# Patient Record
Sex: Female | Born: 1970 | Race: White | Hispanic: No | Marital: Married | Smoking: Never smoker
Health system: Southern US, Community
[De-identification: ages and names within clinical notes are randomized; demographics above are authoritative.]

## PROBLEM LIST (undated history)

## (undated) HISTORY — PX: APPENDECTOMY: SHX54

## (undated) HISTORY — PX: OTHER SURGICAL HISTORY: SHX169

---

## 1998-11-26 ENCOUNTER — Other Ambulatory Visit: Admission: RE | Admit: 1998-11-26 | Discharge: 1998-11-26 | Payer: Self-pay | Admitting: Obstetrics and Gynecology

## 1998-12-04 ENCOUNTER — Ambulatory Visit (HOSPITAL_COMMUNITY): Admission: RE | Admit: 1998-12-04 | Discharge: 1998-12-04 | Payer: Self-pay | Admitting: Obstetrics and Gynecology

## 1998-12-04 ENCOUNTER — Encounter: Payer: Self-pay | Admitting: Obstetrics and Gynecology

## 1998-12-20 ENCOUNTER — Ambulatory Visit (HOSPITAL_COMMUNITY): Admission: RE | Admit: 1998-12-20 | Discharge: 1998-12-20 | Payer: Self-pay | Admitting: Obstetrics and Gynecology

## 1998-12-20 ENCOUNTER — Encounter: Payer: Self-pay | Admitting: Obstetrics and Gynecology

## 1999-06-17 ENCOUNTER — Ambulatory Visit (HOSPITAL_COMMUNITY): Admission: RE | Admit: 1999-06-17 | Discharge: 1999-06-17 | Payer: Self-pay | Admitting: Obstetrics and Gynecology

## 1999-06-17 ENCOUNTER — Encounter: Payer: Self-pay | Admitting: Obstetrics and Gynecology

## 1999-12-20 ENCOUNTER — Other Ambulatory Visit: Admission: RE | Admit: 1999-12-20 | Discharge: 1999-12-20 | Payer: Self-pay | Admitting: Obstetrics and Gynecology

## 1999-12-30 ENCOUNTER — Ambulatory Visit (HOSPITAL_COMMUNITY): Admission: RE | Admit: 1999-12-30 | Discharge: 1999-12-30 | Payer: Self-pay | Admitting: Obstetrics and Gynecology

## 1999-12-30 ENCOUNTER — Encounter: Payer: Self-pay | Admitting: Obstetrics and Gynecology

## 2000-12-24 ENCOUNTER — Other Ambulatory Visit: Admission: RE | Admit: 2000-12-24 | Discharge: 2000-12-24 | Payer: Self-pay | Admitting: Obstetrics and Gynecology

## 2003-03-08 ENCOUNTER — Other Ambulatory Visit: Admission: RE | Admit: 2003-03-08 | Discharge: 2003-03-08 | Payer: Self-pay | Admitting: Obstetrics and Gynecology

## 2003-04-12 ENCOUNTER — Ambulatory Visit (HOSPITAL_COMMUNITY): Admission: RE | Admit: 2003-04-12 | Discharge: 2003-04-12 | Payer: Self-pay | Admitting: Obstetrics and Gynecology

## 2003-04-12 ENCOUNTER — Encounter: Payer: Self-pay | Admitting: Obstetrics and Gynecology

## 2003-05-31 ENCOUNTER — Other Ambulatory Visit: Admission: RE | Admit: 2003-05-31 | Discharge: 2003-05-31 | Payer: Self-pay | Admitting: Obstetrics and Gynecology

## 2004-01-17 ENCOUNTER — Other Ambulatory Visit: Admission: RE | Admit: 2004-01-17 | Discharge: 2004-01-17 | Payer: Self-pay | Admitting: Gynecology

## 2004-10-04 ENCOUNTER — Inpatient Hospital Stay (HOSPITAL_COMMUNITY): Admission: AD | Admit: 2004-10-04 | Discharge: 2004-10-04 | Payer: Self-pay | Admitting: Gynecology

## 2004-12-17 DIAGNOSIS — D229 Melanocytic nevi, unspecified: Secondary | ICD-10-CM

## 2004-12-17 HISTORY — DX: Melanocytic nevi, unspecified: D22.9

## 2005-03-24 ENCOUNTER — Other Ambulatory Visit: Admission: RE | Admit: 2005-03-24 | Discharge: 2005-03-24 | Payer: Self-pay | Admitting: Gynecology

## 2005-09-10 ENCOUNTER — Ambulatory Visit (HOSPITAL_BASED_OUTPATIENT_CLINIC_OR_DEPARTMENT_OTHER): Admission: RE | Admit: 2005-09-10 | Discharge: 2005-09-10 | Payer: Self-pay | Admitting: Surgery

## 2005-09-10 ENCOUNTER — Encounter (INDEPENDENT_AMBULATORY_CARE_PROVIDER_SITE_OTHER): Payer: Self-pay | Admitting: *Deleted

## 2007-07-29 ENCOUNTER — Other Ambulatory Visit: Admission: RE | Admit: 2007-07-29 | Discharge: 2007-07-29 | Payer: Self-pay | Admitting: Gynecology

## 2007-08-06 ENCOUNTER — Encounter: Admission: RE | Admit: 2007-08-06 | Discharge: 2007-08-06 | Payer: Self-pay | Admitting: Gynecology

## 2007-09-01 ENCOUNTER — Encounter: Admission: RE | Admit: 2007-09-01 | Discharge: 2007-09-01 | Payer: Self-pay | Admitting: Gynecology

## 2007-09-01 ENCOUNTER — Encounter (INDEPENDENT_AMBULATORY_CARE_PROVIDER_SITE_OTHER): Payer: Self-pay | Admitting: Diagnostic Radiology

## 2008-06-30 ENCOUNTER — Inpatient Hospital Stay (HOSPITAL_COMMUNITY): Admission: AD | Admit: 2008-06-30 | Discharge: 2008-06-30 | Payer: Self-pay | Admitting: Obstetrics and Gynecology

## 2008-07-14 IMAGING — US UNKNOWN PR STUDY
1 series · 12 of 12 positions shown · non-contrast
Comparison: none

US GUIDED CORE BIOPSY
Technologist: Jasinto Apollon, Medical

RIGHT BREAST ULTRASOUND-GUIDED CORE BIOPSY
CLINICAL DATA: 36-year-old seen August 06, 2007 for palpable mass right breast.  Diagnostic 
mammogram and right breast ultrasound at that time revealed a 1.7 x 1.3 x 0.7 cm mildly hypoechoic 
oval-shaped mass with lobulations, suggestive of fibroadenoma.  Initially, we planned to do a six 
month follow-up with ultrasound.  However, later in thinking about the patient's considerations of 
possibly becoming pregnant in the near future, I called Ms. Winnie to suggest that we perform 
biopsy of this newly palpable mass for pathologic diagnosis.  Therefore, the patient presents for 
biopsy today.

[Series 1: unknown pr study · 12 of 12 slices shown]
[im 1/12]
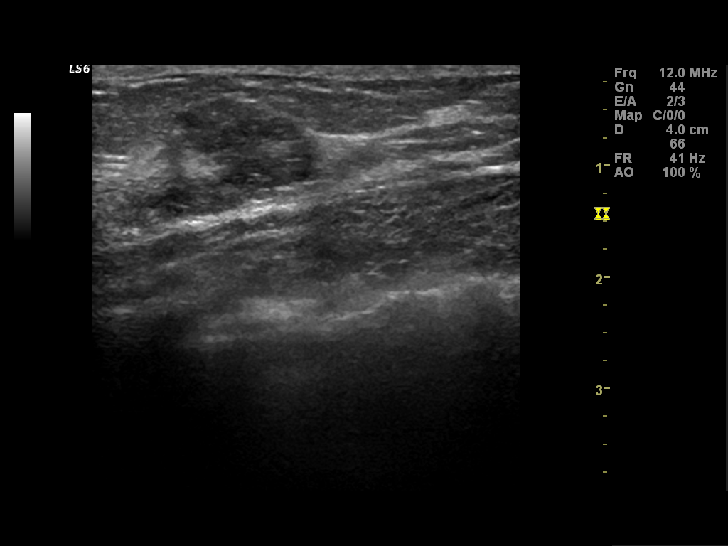
[im 2/12]
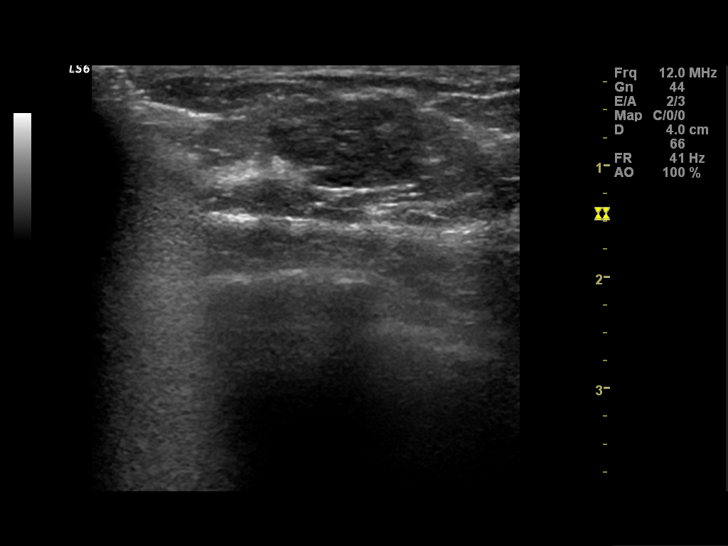
[im 3/12]
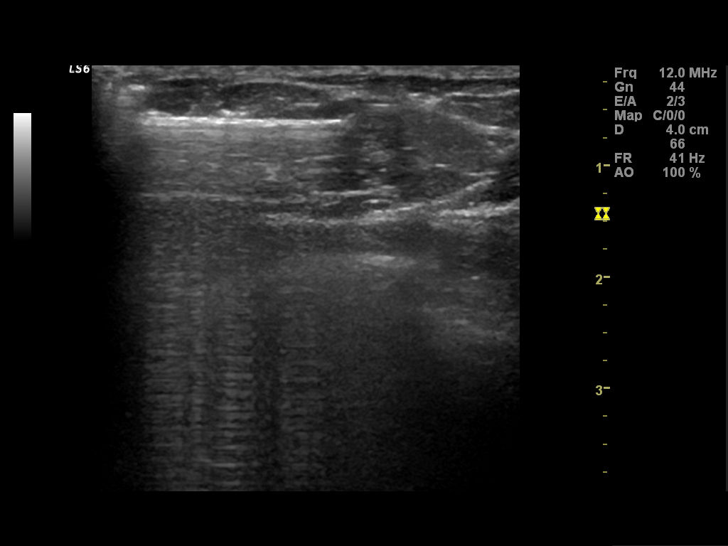
[im 4/12]
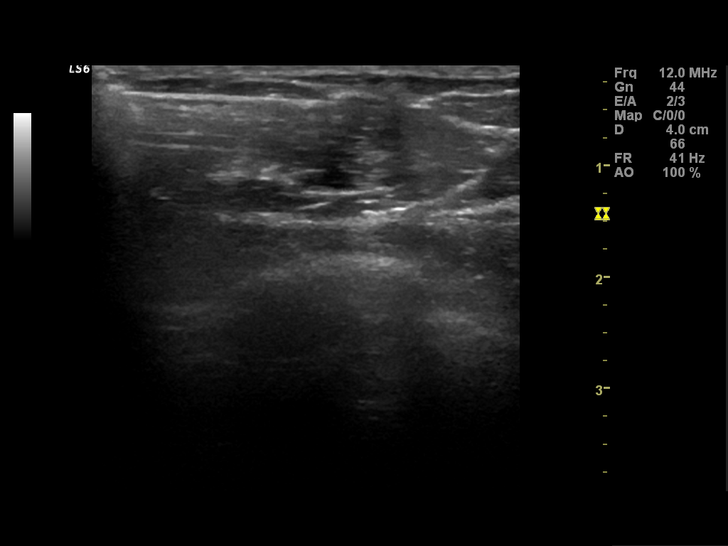
[im 5/12]
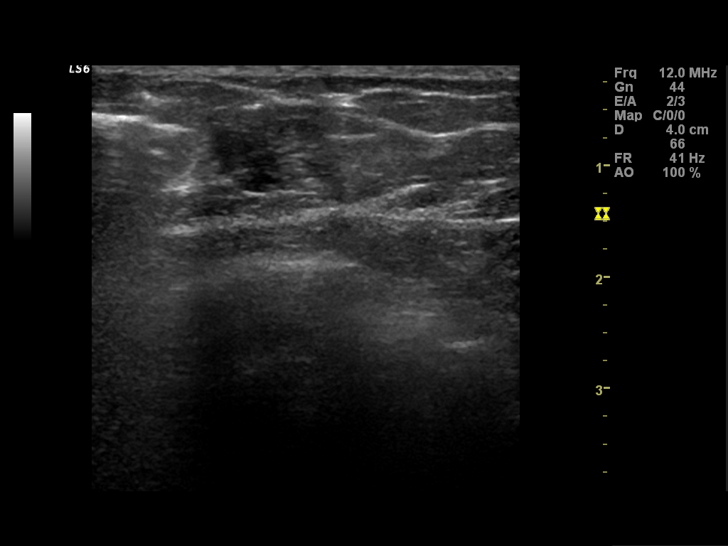
[im 6/12]
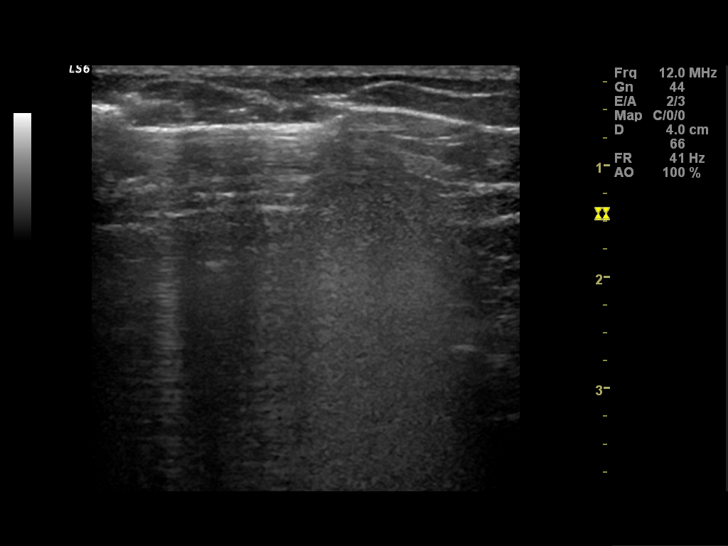
[im 7/12]
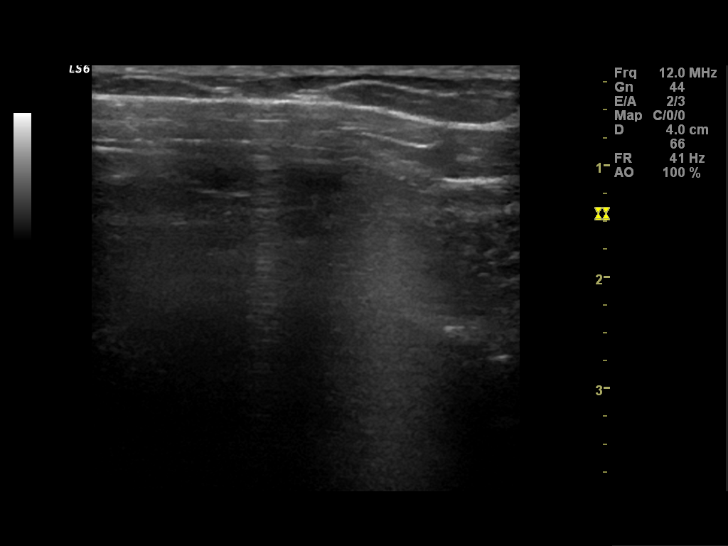
[im 8/12]
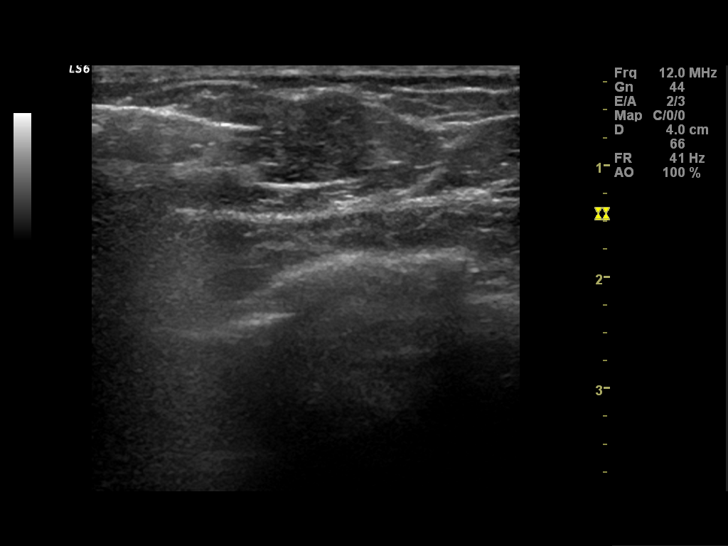
[im 9/12]
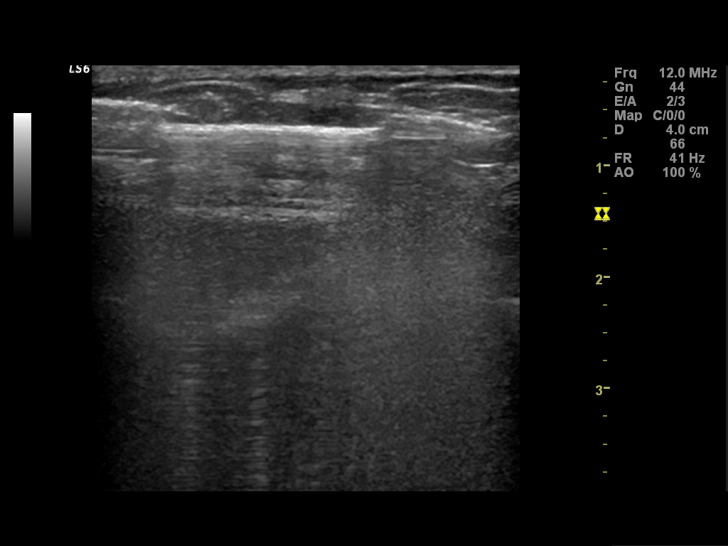
[im 10/12]
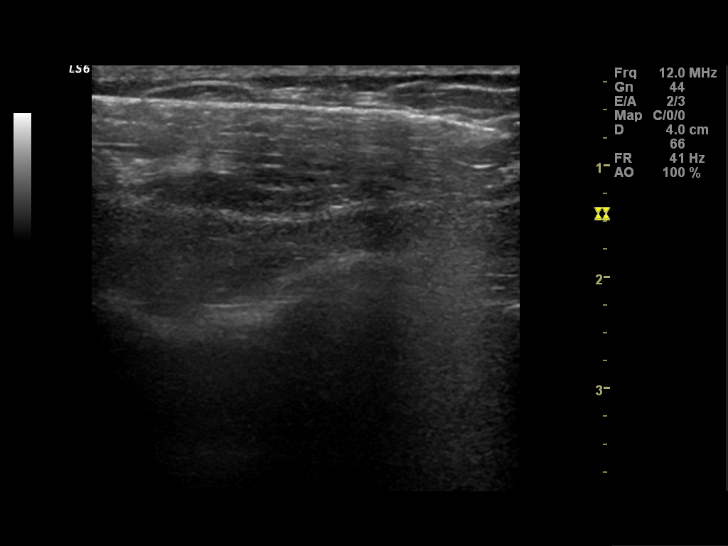
[im 11/12]
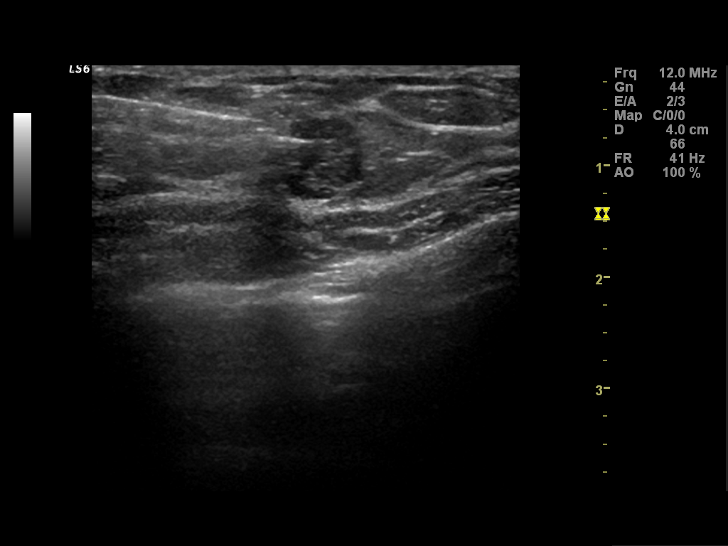
[im 12/12]
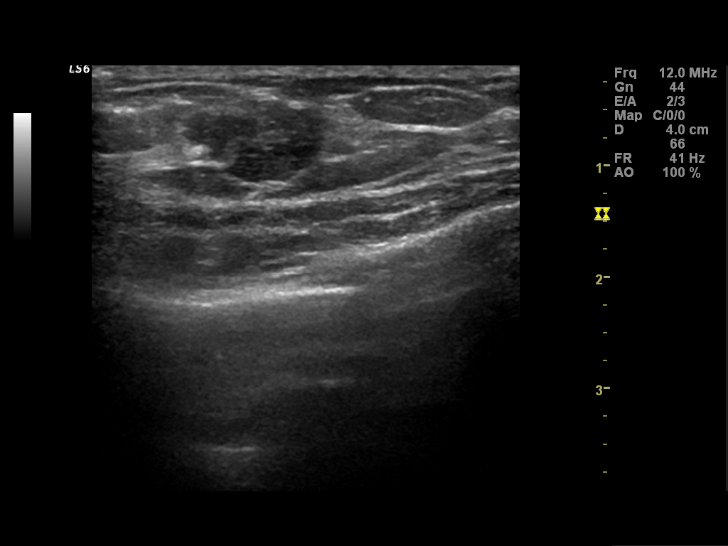

[12 of 12 positions shown; findings below may reference images not displayed]

Written informed consent was obtained. The possibilities of bleeding, hematoma formation, 
infection, non-diagnostic tissue sampling and the plan to place a biopsy marker were discussed with
the patient.

Using ultrasound guidance, sterile technique, 1% Lidocaine for local anesthesia, and a 12-gauge 
Celero biopsy device, three core samples were obtained of the mass at 3 o'clock position 
approximately 3 cm from the nipple.  Specimens were submitted to pathology in [place].  The 
patient would like to be called with biopsy results tomorrow.

A post-biopsy mammogram performed today shows the  metallic marker to be in good position, along 
the inferomedial margin of the mass.

PATHOLOGY RESULTS: BENIGN
Benign fibroadenoma.

Pathology results dictated by Rigoberto Meisner, RN, on 09-02-07.

Pathology reveals fibroadenoma.  This was found to be concordant by Dr. Maravilhas Alago.  I spoke with 
the patient by phone to give her the biopsy results. She stated that her biopsy site was negative 
for bleeding, bruising, or tenderness.  She was asked to return for yearly mammography at age 40.

Screening mammogram of both breasts at age 40.
,

## 2008-07-27 ENCOUNTER — Inpatient Hospital Stay (HOSPITAL_COMMUNITY): Admission: AD | Admit: 2008-07-27 | Discharge: 2008-07-27 | Payer: Self-pay | Admitting: Obstetrics and Gynecology

## 2008-08-01 ENCOUNTER — Inpatient Hospital Stay (HOSPITAL_COMMUNITY): Admission: RE | Admit: 2008-08-01 | Discharge: 2008-08-06 | Payer: Self-pay | Admitting: Obstetrics and Gynecology

## 2008-08-03 ENCOUNTER — Encounter (INDEPENDENT_AMBULATORY_CARE_PROVIDER_SITE_OTHER): Payer: Self-pay | Admitting: Obstetrics and Gynecology

## 2010-10-28 LAB — COMPREHENSIVE METABOLIC PANEL
ALT: 31 U/L (ref 0–35)
AST: 27 U/L (ref 0–37)
Albumin: 2.8 g/dL — ABNORMAL LOW (ref 3.5–5.2)
Alkaline Phosphatase: 122 U/L — ABNORMAL HIGH (ref 39–117)
Chloride: 106 mEq/L (ref 96–112)
GFR calc Af Amer: >60 mL/min (ref >60)
Potassium: 3.4 mEq/L — ABNORMAL LOW (ref 3.5–5.1)
Sodium: 136 mEq/L (ref 135–145)
Total Bilirubin: 0.2 mg/dL — ABNORMAL LOW (ref 0.3–1.2)

## 2010-10-28 LAB — CBC
HCT: 29.2 % — ABNORMAL LOW (ref 36.0–46.0)
HCT: 33.3 % — ABNORMAL LOW (ref 36.0–46.0)
Hemoglobin: 10.3 g/dL — ABNORMAL LOW (ref 12.0–15.0)
MCHC: 35.3 g/dL (ref 30.0–36.0)
Platelets: 237 10*3/uL (ref 150–400)
RBC: 3.17 MIL/uL — ABNORMAL LOW (ref 3.87–5.11)
RDW: 12.7 % (ref 11.5–15.5)
WBC: 10.6 10*3/uL — ABNORMAL HIGH (ref 4.0–10.5)

## 2010-11-26 NOTE — Op Note (Signed)
NAME:  Jennifer Schneider, Jennifer Schneider               ACCOUNT NO.:  [id number]   MEDICAL RECORD NO.:  [id number]          PATIENT TYPE:  INP   LOCATION:  9120                          FACILITY:  [facility]   PHYSICIAN:  Guy Sandifer. Henderson Cloud, M.D. DATE OF BIRTH:  [birth year]   DATE OF PROCEDURE:  08/03/2008  DATE OF DISCHARGE:                               OPERATIVE REPORT   PREOPERATIVE DIAGNOSIS:  Arrest of cervical dilation.   POSTOPERATIVE DIAGNOSIS:  Arrest of cervical dilation.   PROCEDURE:  Low transverse cesarean section.   SURGEON:  Guy Sandifer. Henderson Cloud, MD   ANESTHESIA:  Spinal, Jean Rosenthal, MD   ESTIMATED BLOOD LOSS:  800 mL.   SPECIMENS:  Placenta to Pathology.   FINDINGS:  Viable female infant, Apgars of 9 and 9 at 1 and 5 minutes  respectively.  Birth weight is 7 pounds 5 ounces.  Arterial cord pH  7.37.   INDICATIONS AND CONSENT:  This patient is a 40 year old G1, P0 with an  EDC of August 08, 2008, who presents for induction of labor with  elevated blood pressures.  Laboratories were normal.  She undergo  Cytotec induction and then Pitocin.  Yesterday, the Pitocin was  discontinued.  The Cytotec was repeated and the Pitocin was resumed  again today.  As of 8:10 this morning, cervix was closed, thick, soft,  and -3 in vertex.  Blood pressures were stable, and deep tendon reflexes  were 2+.  She had had Cytotec twice last evening and was on Pitocin.  The patient then had spontaneous rupture of membranes for copious  amounts of clear fluid.  Cervix is fingertip, 80%, -3, and posterior in  location.  Fetal heart tones were reactive.  Pitocin was at 24  milliunits.  Pitocin was continued until 4:30 p.m. where the  contractions continued to be irregular in mild, and the cervix was  without change.  Arrest of dilation was discussed with the patient.  Cesarean section was discussed.  Potential risks and complications were  discussed preoperatively including, but not limited to infection, organ  damage,  bleeding requiring transfusion of blood products with HIV and  hepatitis acquisition, DVT, PE, and pneumonia.  All questions have been  answered and consent was signed on the chart.   PROCEDURE IN DETAIL:  The patient was taken to the operating room where  she was identified, spinal anesthetic was placed, and she was placed in  the dorsal supine position with a 15-degree left lateral wedge.  She was  then prepped, bladder was drained with an indwelling Foley catheter, and  she was draped in a sterile fashion.  After testing for adequate spinal  anesthesia, skin was entered through a Pfannenstiel incision and  dissection was carried out in layers to the peritoneum.  The peritoneum  was incised and extended superiorly and inferiorly.  Vesicouterine  peritoneum was taken down cephalad laterally, the bladder flap was  developed, and the bladder blade was placed.  Uterus was incised in a  low transverse manner and the uterine cavity was entered bluntly with a  hemostat.  The uterine incision was extended cephalad  laterally with the  finger.  The vertex was elevated.  Vacuum extractor was used to elevate  the vertex through the rectus muscles.  One gentle traction on the  vacuum extractor with no pop offs was used.  This easily elevates the  vertex.  Suction was released, the oronasopharynx was thoroughly  suctioned, and the baby was delivered.  Good cry and tone was noted.  Cord was clamped and cut, and the baby was handed to the awaiting  pediatrics team.  Placenta was manually delivered and sent to Pathology.  Uterine cavity was clean.  Uterus was closed in 2 running locking  imbricating layers of 0 Monocryl suture.  There was a small amount of  bleeding in the center of the incision, which was closed with a third  layer of 0 Monocryl across the middle one third of the incision, which  achieved good hemostasis.  Tubes and ovaries were normal bilaterally.  Irrigation was carried out.   Anterior peritoneum was closed in a running  fashion, which was also used to reapproximate the pyramidalis muscle in  the midline.  Anterior rectus fascia was closed in a running fashion  with double-looped 0 PDS suture.  Skin was closed with clips.  All  sponge, instrument, and needle counts were correct, and the patient was  transferred to recovery room in stable condition.      Guy Sandifer Henderson Cloud, M.D.  Electronically Signed     JET/MEDQ  D:  08/03/2008  T:  08/04/2008  Job:  [id number]

## 2010-11-29 NOTE — Op Note (Signed)
NAME:  NIKEISHA, KLUTZ               ACCOUNT NO.:  [id number]   MEDICAL RECORD NO.:  [id number]          PATIENT TYPE:  AMB   LOCATION:  DSC                          FACILITY:  [facility]   PHYSICIAN:  Thornton Park. Daphine Deutscher, MD  DATE OF BIRTH:  [birth year]   DATE OF PROCEDURE:  09/10/2005  DATE OF DISCHARGE:                                 OPERATIVE REPORT   PREOPERATIVE DIAGNOSIS:  Palpable mass right breast.   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   PROCEDURE:  Right breast biopsy.   SURGEON:  Thornton Park. Daphine Deutscher, MD   ANESTHESIA:  Local MAC.   DESCRIPTION OF PROCEDURE:  Keimora Swartout was localized and marked and taken  to room 4.  The breast was prepped with chlorhexidine and draped sterilely.  I infiltrated the area with a mixture of lidocaine and sodium bicarb.  A  curvilinear incision was made about 2 fingerbreadths from the areolar margin  and carried down.  Superior and inferior flaps were made overlying the mass.  It was then grasped with a suture of 4-0 Vicryl and held and then was  dissected free from the surrounding breast tissue with Metzenbaum scissors.  It was removed and sent for permanent sections.  In the meantime, the breast  cavity was cauterized with electrocautery.  It was injected with more  lidocaine and closed 4-0 and 5-0 Vicryl with Dermabond on the outside skin.  The patient tolerated procedure well and was taken to recovery room  satisfactory addition.  She was given a prescription for Tylox to take for  pain and will follow-up in the office 2-3 weeks.      Thornton Park Daphine Deutscher, MD  Electronically Signed     MBM/MEDQ  D:  09/10/2005  T:  09/10/2005  Job:  [id number]

## 2010-11-29 NOTE — Discharge Summary (Signed)
NAME:  Jennifer Schneider, Jennifer Schneider               ACCOUNT NO.:  [id number]   MEDICAL RECORD NO.:  [id number]          PATIENT TYPE:  INP   LOCATION:  9120                          FACILITY:  [facility]   PHYSICIAN:  Freddy Finner, M.D.   DATE OF BIRTH:  1970/07/18   DATE OF ADMISSION:  08/01/2008  DATE OF DISCHARGE:  08/06/2008                               DISCHARGE SUMMARY   ADMITTING DIAGNOSES:  1. Intrauterine pregnancy at 74 weeks' estimated gestational age.  2. Mild pregnancy-induced hypertension.  3. Induction of labor.   DISCHARGE DIAGNOSES:  1. Status post low transverse cesarean section secondary to failed      induction.  2. Viable female infant.   PROCEDURE:  Primary low transverse cesarean section.   REASON FOR ADMISSION:  Please see written H and P.   HOSPITAL COURSE:  The patient is a 39 year old married female,  primigravida, who was admitted to [facility] for an  induction of labor at 44 weeks' estimated gestational age for mild  pregnancy-induced hypertension.  On admission, vital signs were stable.  Blood pressure 130/84.  Cervix was closed posterior in vertex  presentation.  Fetal heart tones were reactive at 142 beats per minute.  Cytotec was administered that evening, and Pitocin was started the  following morning.  The patient did undergo 2 Cytotec with Pitocin and  with no further change in cervix throughout the day beyond a fingertip  dilated, 80% effaced and a -3 station.  Due to failed a induction,  decision was made to proceed with a primary low transverse cesarean  section.  The patient was then taken to the operating room where spinal  anesthesia was administered without difficulty.  A low transverse  incision was made with delivery of a viable female infant weighing 7  pounds 6 ounces with Apgars of 9 at 1 minute and 9 at 5 minutes.  The  patient tolerated the procedure well and was taken to the recovery room  in stable condition.  On postoperative day 1,  the patient was with  complaints of itching.  Otherwise, vital signs were stable.  She was  afebrile.  Fundus firm and nontender.  Abdominal dressing was noted to  be clean, dry, and intact.  Laboratory findings showed a hemoglobin of  10.7.  On postoperative day 2, the patient was without complaint.  Vital  signs remained stable.  She was afebrile.  Fundus firm and nontender.  Abdominal dressing had been removed revealing an incision that was  clean, dry, and intact.  On postoperative day 3, the patient was without  complaint.  Vital signs remained stable.  She was afebrile.  Fundus firm  and nontender.  Incision was clean, dry, and intact.  Staples were  removed, and the patient was later discharged home.   CONDITION ON DISCHARGE:  Stable.   DIET:  Regular as tolerated.   ACTIVITY:  No heavy lifting, no driving x2 weeks, no vaginal entry.   FOLLOWUP:  The patient to follow up in the office in 1-2 weeks for an  incision check.  She is to call  for temperature greater than 100  degrees, persistent nausea, vomiting, heavy vaginal bleeding, and/or  redness or drainage from the incisional site.   DISCHARGE MEDICATIONS:  1. Tylox #30 one p.o. every 4-6 hours.  2. Motrin 600 mg every 6 hours.  3. Prenatal vitamins 1 p.o. daily.  4. Colace 1 p.o. daily.      Julio Sicks, N.P.      Freddy Finner, M.D.  Electronically Signed    CC/MEDQ  D:  08/28/2008  T:  08/28/2008  Job:  [id number]

## 2011-04-18 LAB — CBC
HCT: 35.4 % — ABNORMAL LOW (ref 36.0–46.0)
Hemoglobin: 11.9 g/dL — ABNORMAL LOW (ref 12.0–15.0)
MCV: 93.1 fL (ref 78.0–100.0)
Platelets: 259 10*3/uL (ref 150–400)
RDW: 12.9 % (ref 11.5–15.5)
WBC: 8.3 10*3/uL (ref 4.0–10.5)

## 2011-04-18 LAB — URINALYSIS, ROUTINE W REFLEX MICROSCOPIC
Glucose, UA: NEGATIVE mg/dL
Hgb urine dipstick: NEGATIVE
Ketones, ur: NEGATIVE mg/dL
Protein, ur: NEGATIVE mg/dL
pH: 7 (ref 5.0–8.0)

## 2011-04-18 LAB — COMPREHENSIVE METABOLIC PANEL
Albumin: 3 g/dL — ABNORMAL LOW (ref 3.5–5.2)
Alkaline Phosphatase: 101 U/L (ref 39–117)
BUN: 4 mg/dL — ABNORMAL LOW (ref 6–23)
Chloride: 102 mEq/L (ref 96–112)
Creatinine, Ser: 0.44 mg/dL (ref 0.4–1.2)
Glucose, Bld: 98 mg/dL (ref 70–99)
Total Bilirubin: 0.5 mg/dL (ref 0.3–1.2)

## 2011-04-18 LAB — LACTATE DEHYDROGENASE: LDH: 139 U/L (ref 94–250)

## 2011-04-18 LAB — URIC ACID: Uric Acid, Serum: 3.3 mg/dL (ref 2.4–7.0)

## 2011-12-10 ENCOUNTER — Other Ambulatory Visit: Payer: Self-pay | Admitting: Dermatology

## 2012-12-28 ENCOUNTER — Other Ambulatory Visit: Payer: Self-pay | Admitting: Obstetrics and Gynecology

## 2012-12-28 DIAGNOSIS — R928 Other abnormal and inconclusive findings on diagnostic imaging of breast: Secondary | ICD-10-CM

## 2013-01-12 ENCOUNTER — Other Ambulatory Visit: Payer: Self-pay

## 2013-01-24 ENCOUNTER — Other Ambulatory Visit: Payer: Self-pay

## 2013-02-01 ENCOUNTER — Ambulatory Visit
Admission: RE | Admit: 2013-02-01 | Discharge: 2013-02-01 | Disposition: A | Discharge status: Home / Self Care | Payer: Commercial Indemnity | Source: Ambulatory Visit | Attending: Obstetrics and Gynecology | Admitting: Obstetrics and Gynecology

## 2013-02-01 DIAGNOSIS — R928 Other abnormal and inconclusive findings on diagnostic imaging of breast: Secondary | ICD-10-CM

## 2015-07-03 ENCOUNTER — Other Ambulatory Visit: Payer: Self-pay | Admitting: Obstetrics and Gynecology

## 2015-07-03 DIAGNOSIS — R928 Other abnormal and inconclusive findings on diagnostic imaging of breast: Secondary | ICD-10-CM

## 2015-07-10 ENCOUNTER — Ambulatory Visit
Admission: RE | Admit: 2015-07-10 | Discharge: 2015-07-10 | Disposition: A | Discharge status: Home / Self Care | Payer: Managed Care, Other (non HMO) | Source: Ambulatory Visit | Attending: Obstetrics and Gynecology | Admitting: Obstetrics and Gynecology

## 2015-07-10 DIAGNOSIS — R928 Other abnormal and inconclusive findings on diagnostic imaging of breast: Secondary | ICD-10-CM

## 2016-05-22 IMAGING — MG MM DIAG BREAST TOMO UNI RIGHT
4 series · 4 of 12 positions shown · non-contrast
Comparison: Previous exam(s).

CLINICAL DATA: Screening recall for a possible mass in the right
breast.

EXAM:
DIGITAL DIAGNOSTIC RIGHT MAMMOGRAM WITH 3D TOMOSYNTHESIS WITH CAD
ULTRASOUND RIGHT BREAST

[R CC]
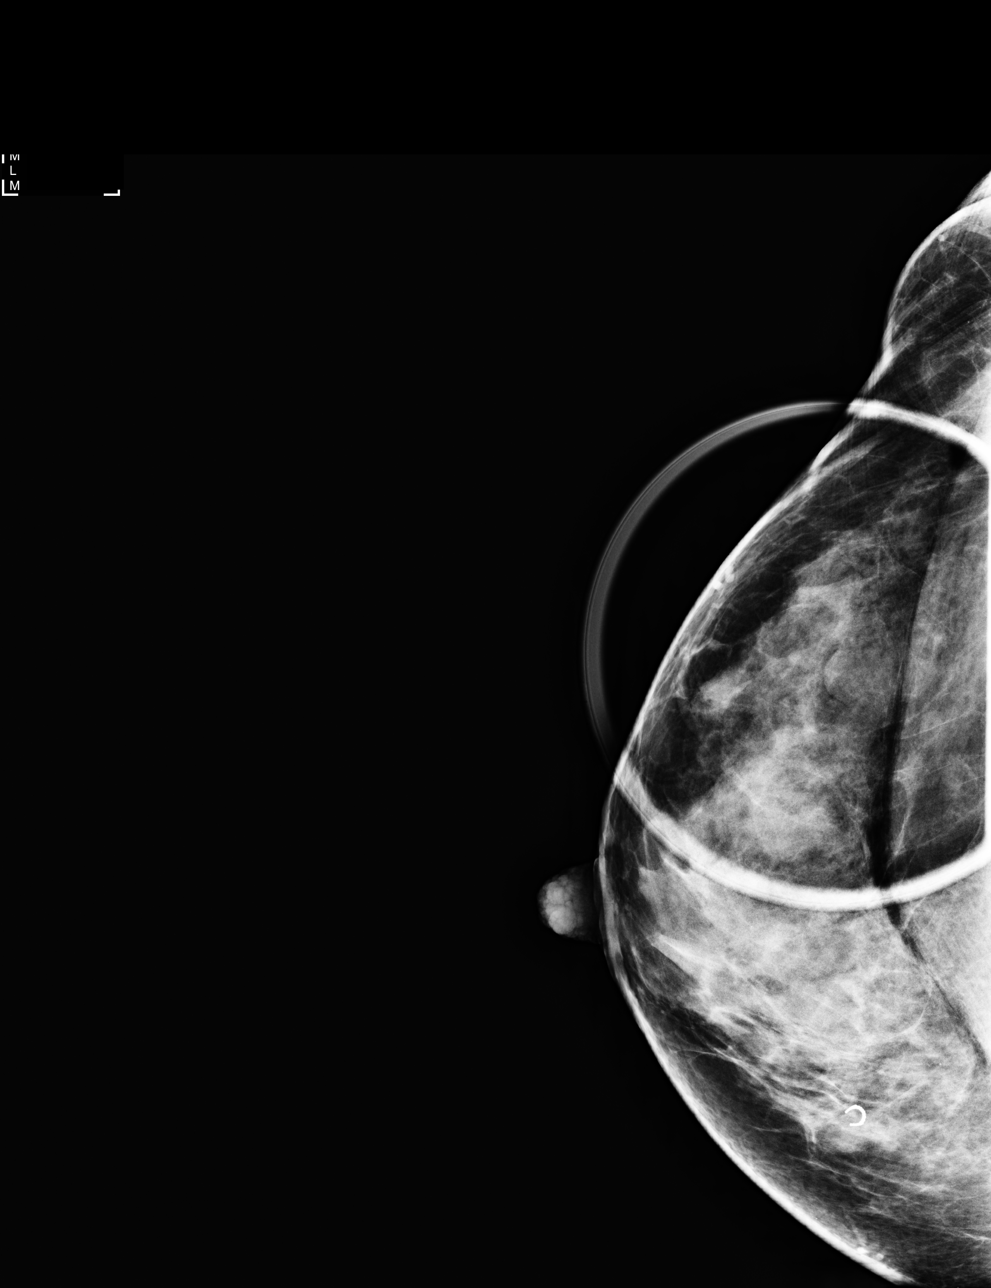

[R MLO]
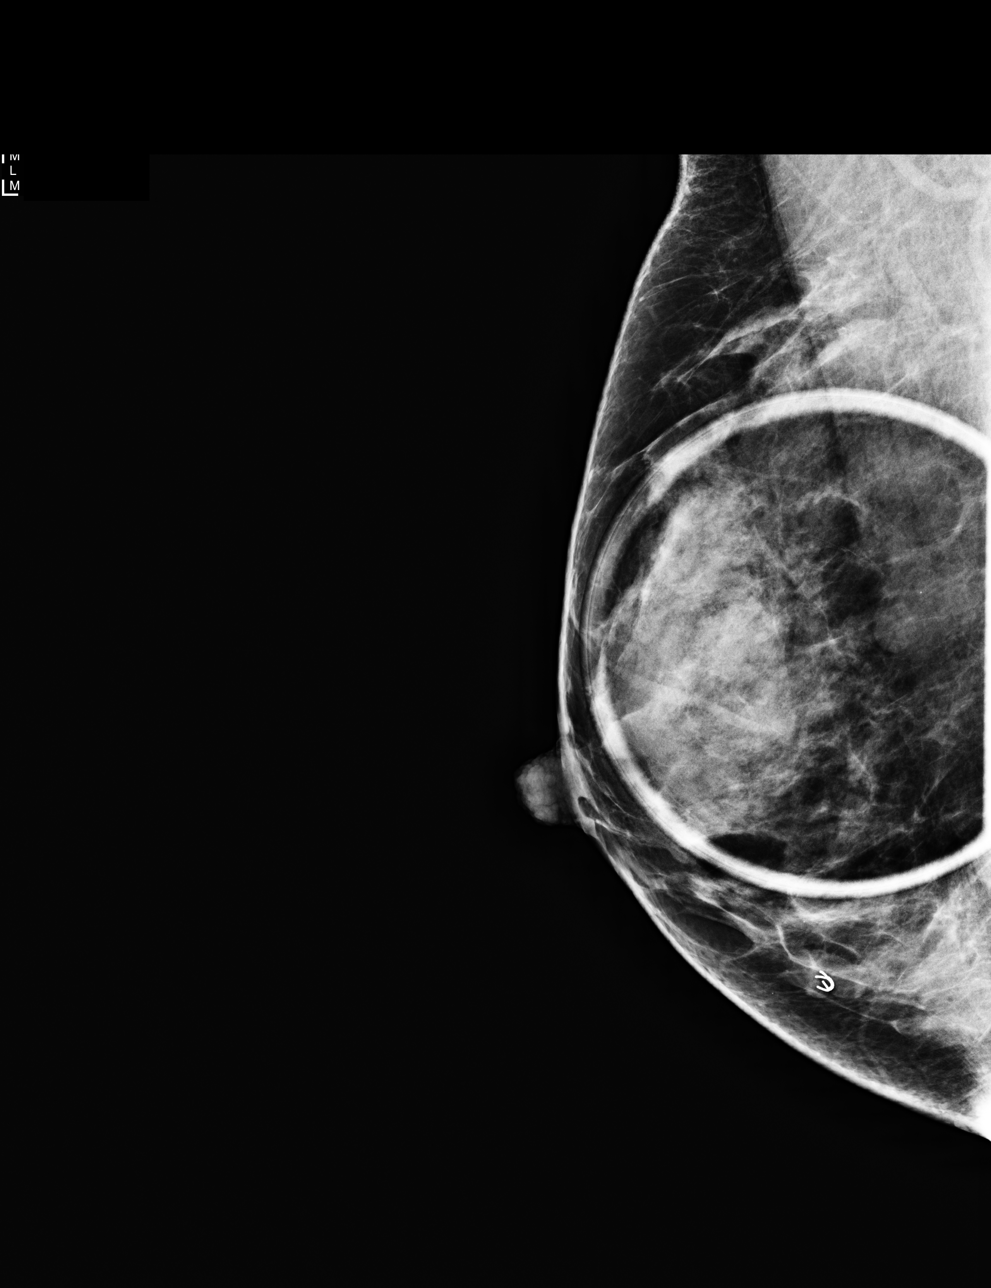

[R MLO tomo · tomo slice 25/48.0]
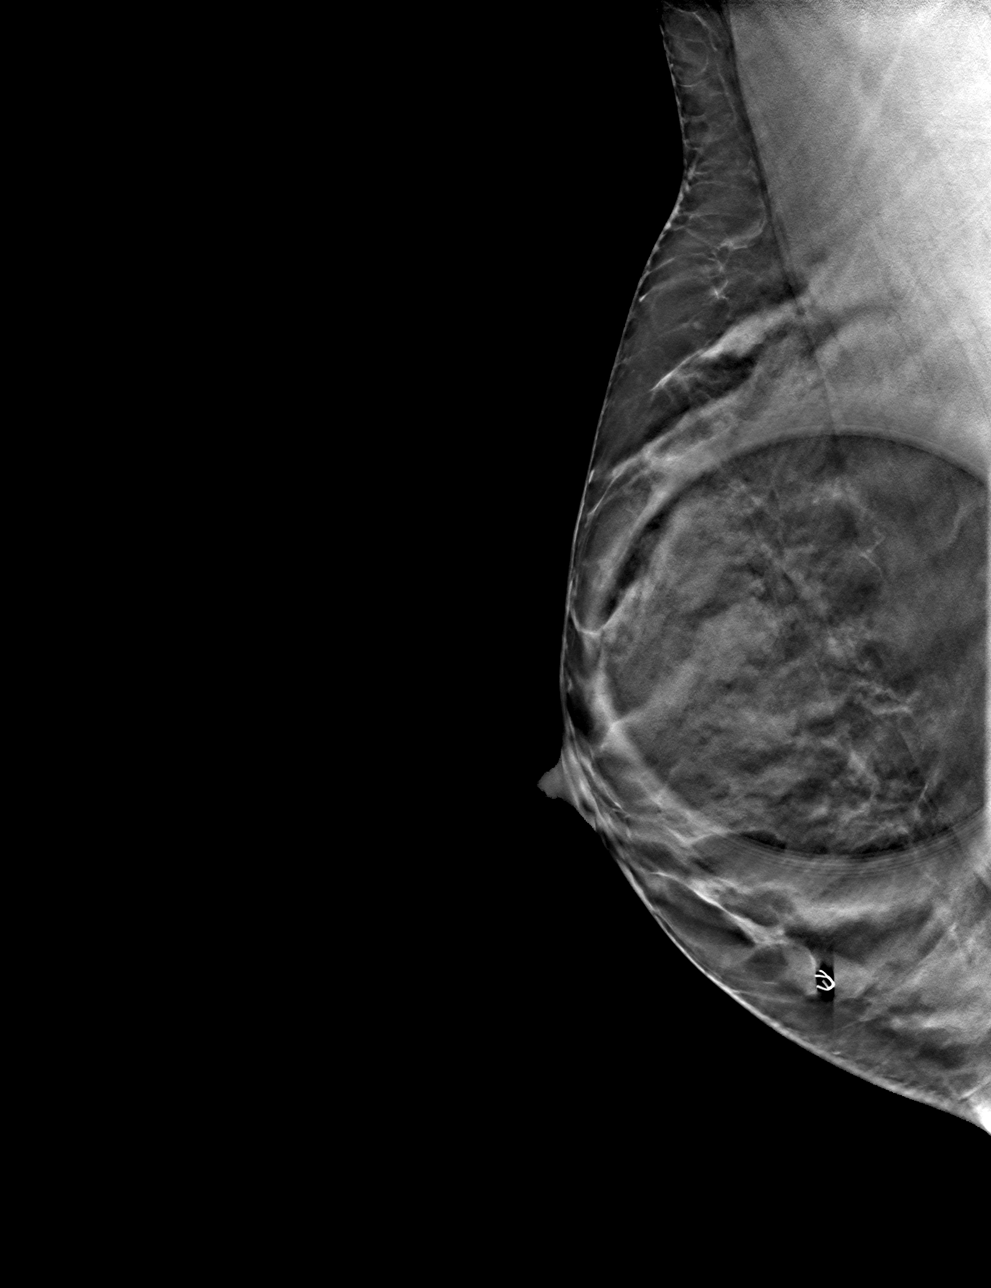

[R CC tomo · tomo slice 27/54.0]
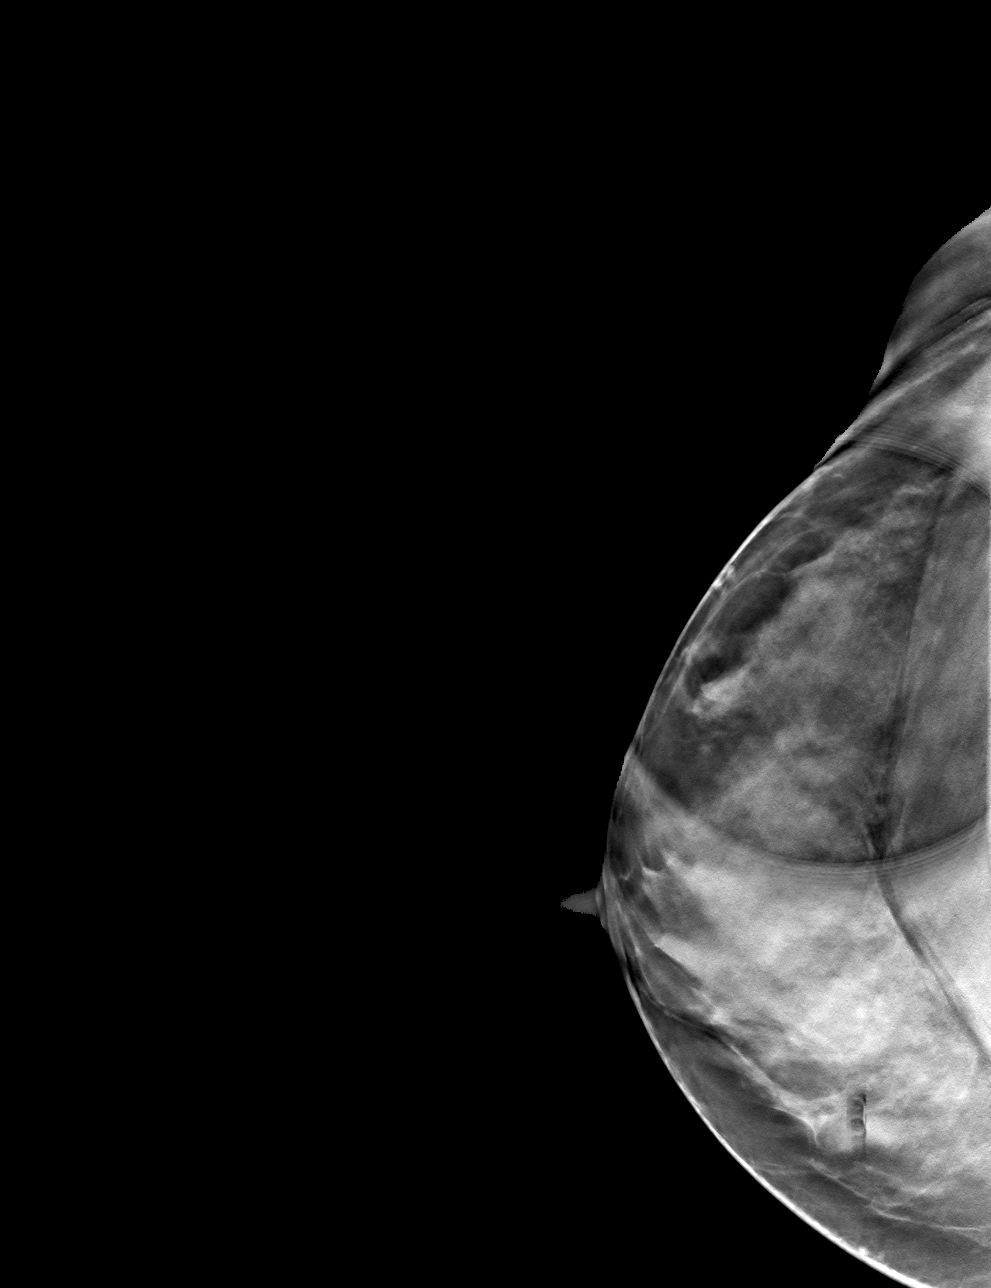

[4 of 12 positions shown; findings below may reference images not displayed]

ACR Breast Density Category c: The breast tissue is heterogeneously
dense, which may obscure small masses.
FINDINGS: On the spot-compression 3D images, the mass persists lying in the
lateral right breast, posterior [DATE]. The mass is margins are partly
visualized mammographically, and appear circumscribed. There are no
other discrete masses and there are no areas of architectural
distortion.

Mammographic images were processed with CAD.

On physical exam, no mass is palpable in the lateral right breast.

Targeted ultrasound is performed, showing a simple oval cyst in the
right breast at the 8:30 o'clock position, 4 cm the nipple,
measuring 9 x 5 x 8 mm. There is an adjacent cluster of small cysts.
There are no solid masses or suspicious lesions. The 9 mm cyst
corresponds in size, shape and location to the mammographic finding.
IMPRESSION: Benign right breast cyst.  No evidence of malignancy.

RECOMMENDATION:
Screening mammogram in one year.(Code:M2-O-G28)

I have discussed the findings and recommendations with the patient.
Results were also provided in writing at the conclusion of the
visit. If applicable, a reminder letter will be sent to the patient
regarding the next appointment.

BI-RADS CATEGORY  2: Benign.

## 2019-05-02 ENCOUNTER — Other Ambulatory Visit: Payer: Self-pay | Admitting: Obstetrics and Gynecology

## 2019-05-02 DIAGNOSIS — R928 Other abnormal and inconclusive findings on diagnostic imaging of breast: Secondary | ICD-10-CM

## 2019-05-09 ENCOUNTER — Ambulatory Visit
Admission: RE | Admit: 2019-05-09 | Discharge: 2019-05-09 | Disposition: A | Discharge status: Home / Self Care | Payer: 59 | Source: Ambulatory Visit | Attending: Obstetrics and Gynecology | Admitting: Obstetrics and Gynecology

## 2019-05-09 ENCOUNTER — Other Ambulatory Visit: Payer: Self-pay

## 2019-05-09 DIAGNOSIS — R928 Other abnormal and inconclusive findings on diagnostic imaging of breast: Secondary | ICD-10-CM

## 2019-10-03 ENCOUNTER — Other Ambulatory Visit: Payer: Self-pay

## 2019-10-03 ENCOUNTER — Encounter: Payer: Self-pay | Admitting: Physician Assistant

## 2019-10-03 ENCOUNTER — Ambulatory Visit (INDEPENDENT_AMBULATORY_CARE_PROVIDER_SITE_OTHER): Payer: 59 | Admitting: Physician Assistant

## 2019-10-03 DIAGNOSIS — Z808 Family history of malignant neoplasm of other organs or systems: Secondary | ICD-10-CM | POA: Diagnosis not present

## 2019-10-03 DIAGNOSIS — D485 Neoplasm of uncertain behavior of skin: Secondary | ICD-10-CM

## 2019-10-03 DIAGNOSIS — Z1283 Encounter for screening for malignant neoplasm of skin: Secondary | ICD-10-CM | POA: Diagnosis not present

## 2019-10-03 DIAGNOSIS — D492 Neoplasm of unspecified behavior of bone, soft tissue, and skin: Secondary | ICD-10-CM

## 2019-10-03 DIAGNOSIS — L820 Inflamed seborrheic keratosis: Secondary | ICD-10-CM

## 2019-10-03 NOTE — Patient Instructions (Signed)

## 2019-10-03 NOTE — Progress Notes (Addendum)
   New Patient Visit  Subjective  Jennifer Schneider is a 49 y.o. female who presents for the following: Skin Problem (Patient here for general skin check. Nothing stands out that is concerning. Mom had a history of melanoma.  [facility] There are some spots that rub on her clothes. She has had moles removed in the past and all were benign. Mom's melanoma was on her hip and it was caught early and has not caused her any further problems.   Objective  Well appearing patient in no apparent distress; mood and affect are within normal limits.  A full examination was performed including scalp, head, eyes, ears, nose, lips, neck, chest, axillae, abdomen, back, buttocks, bilateral upper extremities, bilateral lower extremities, hands, feet, fingers, toes, fingernails, and toenails. All findings within normal limits unless otherwise noted below. Patient has many moles with no distinct mole pattern. Photos were taken to observe for changes.  Objective  Left Lower Back (4), Right Abdomen (side) - Upper (3), Right Lower Back (4), Right Temple: Erythematous stuck-on, waxy papule or plaque.   Objective  Left Lower Back: 6.5 to sk     Objective  Right Axilla: 5cm to raised SK.     Objective  Right Lower Leg - Anterior: 3.5 to freckle     Objective  Left Shoulder - Anterior: 5cm to raised mole     Objective  Chest - [facility]): Head to toe skin cancer screening.  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) Right Abdomen (side) - Upper (3); Left Lower Back (4); Right Lower Back (4); Right Temple  Destruction of lesion - Left Lower Back, Right Abdomen (side) - Upper, Right Lower Back, Right Temple Complexity: simple   Destruction method: cryotherapy   Informed consent: discussed and consent obtained   Timeout:  patient name, date of birth, surgical site, and procedure verified Lesion destroyed using liquid nitrogen: Yes   Outcome:  patient tolerated procedure well with no complications    Neoplasm of skin (4) Left Lower Back  Skin / nail biopsy Type of biopsy: tangential   Informed consent: discussed and consent obtained   Outcome: patient tolerated procedure well   Post-procedure details: wound care instructions given    Specimen 1 - Surgical pathology Differential Diagnosis: atypia Check Margins: No  Right Axilla  Skin / nail biopsy Type of biopsy: tangential   Hemostasis achieved with: aluminum chloride   Outcome: patient tolerated procedure well   Post-procedure details: wound care instructions given    Specimen 2 - Surgical pathology Differential Diagnosis: Atypia Check Margins: No  Right Lower Leg - Anterior  Skin / nail biopsy Type of biopsy: tangential   Hemostasis achieved with: aluminum chloride   Outcome: patient tolerated procedure well   Post-procedure details: wound care instructions given    Specimen 3 - Surgical pathology Differential Diagnosis: Atypia Check Margins: No  Left Shoulder - Anterior  Skin / nail biopsy Type of biopsy: tangential   Hemostasis achieved with: aluminum chloride   Outcome: patient tolerated procedure well   Post-procedure details: wound care instructions given    Specimen 4 - Surgical pathology Differential Diagnosis: Atypia Check Margins: No  Skin exam for malignant neoplasm Family History of Melanoma   I, Kahlia Lagunes Clark-Bruning, PA-C, have reviewed all documentation for this visit. The documentation on 11/11/19 for the exam, diagnosis, procedures, and orders are all accurate and complete.

## 2019-10-06 ENCOUNTER — Telehealth: Payer: Self-pay

## 2019-10-06 DIAGNOSIS — D239 Other benign neoplasm of skin, unspecified: Secondary | ICD-10-CM

## 2019-10-06 NOTE — Telephone Encounter (Signed)
-----   Message from Arlyss Gandy, [place] sent at 10/05/2019  5:07 PM EDT ----- She has a mild on her back we can just watch. The atypical melanocytic proliferation should go to [facility]. The severe on her shoulder will need a wider shave depending on when she gets  [facility] appt.

## 2019-10-06 NOTE — Telephone Encounter (Signed)
Spoke with patient and path results given.  Told her that we would send everything over to the [facility] and get her scheduled for [facility] and then we will schedule her for a widershave w/ [facility] after we have the appointment with the [facility].

## 2019-10-12 ENCOUNTER — Telehealth: Payer: Self-pay

## 2019-10-12 NOTE — Telephone Encounter (Signed)
Fax from [place] to notify us that the patient has an appointment on April 14,2021 at 3:00pm with Dr. Winifred Olive in [place].

## 2019-11-10 ENCOUNTER — Encounter: Payer: Self-pay | Admitting: *Deleted

## 2020-03-21 IMAGING — US US BREAST*R* LIMITED INC AXILLA
1 series · 5 of 5 positions shown · non-contrast
Comparison: Previous exam(s).

CLINICAL DATA: Patient was called back from screening mammogram for
a right breast mass.

EXAM:
ULTRASOUND OF THE RIGHT BREAST

[Series 1: us breast*right* limited inc axilla · 0.07mm/px · 5 of 5 slices shown]
[im 1/5]
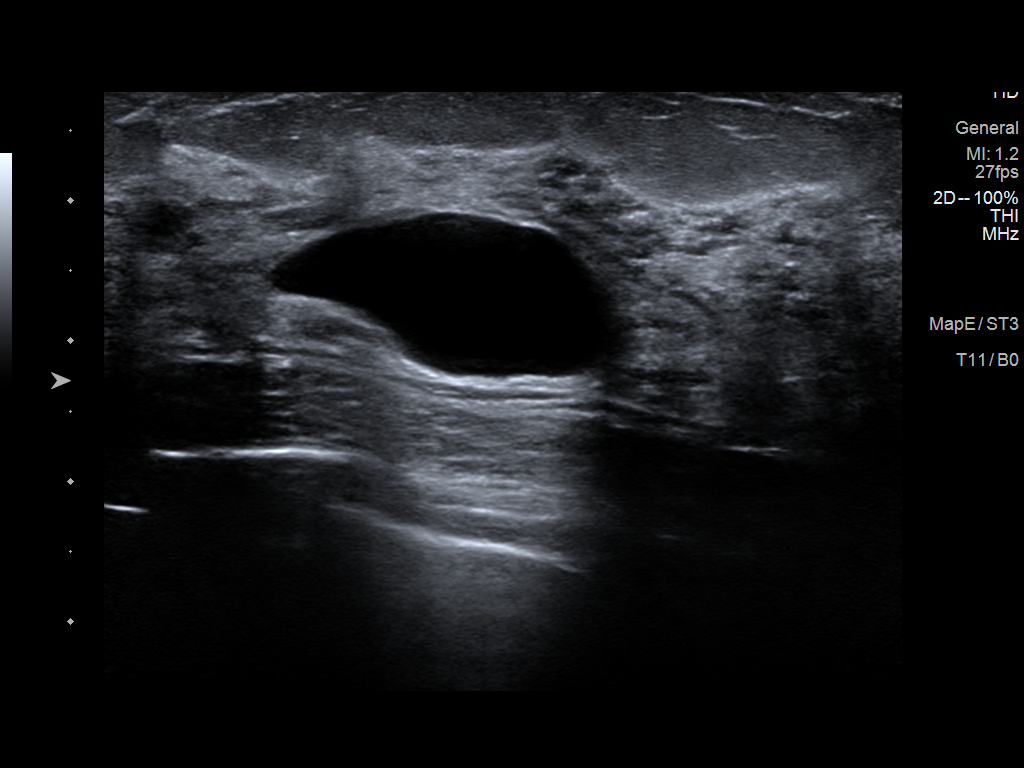
[im 2/5]
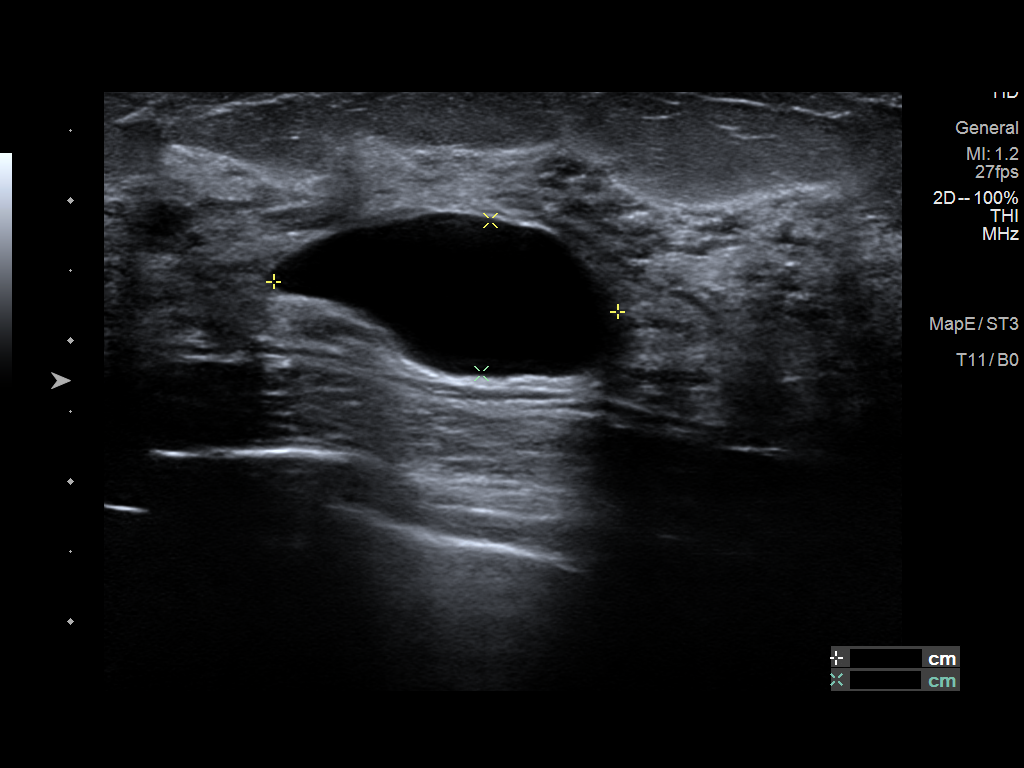
[im 3/5]
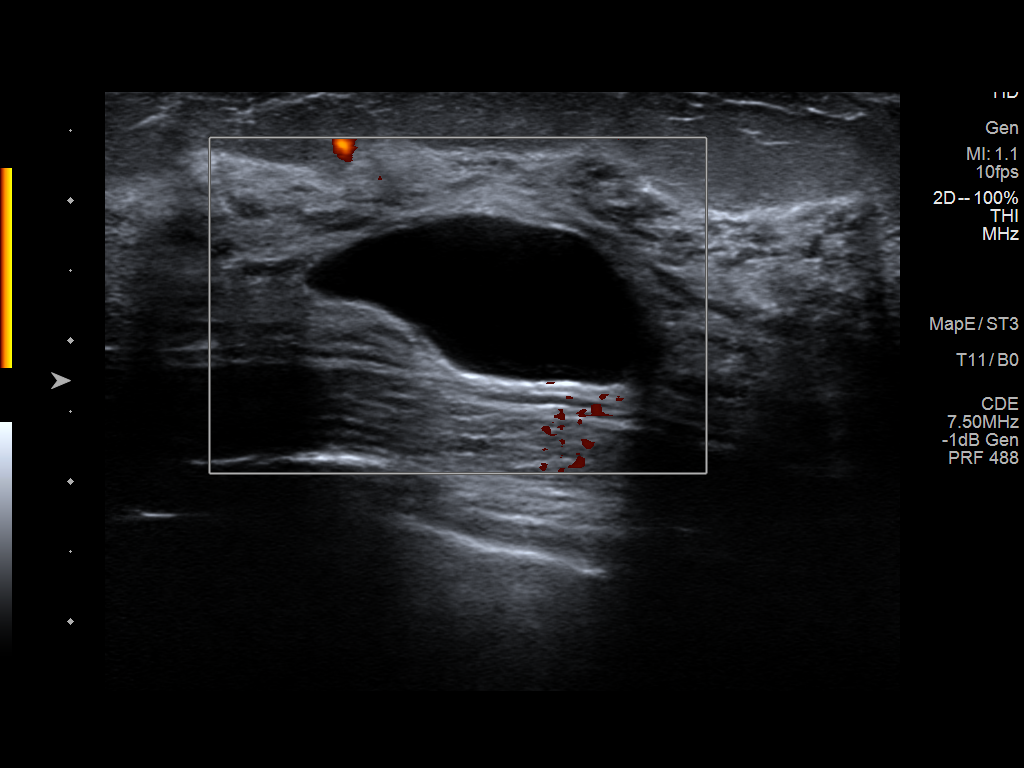
[im 4/5]
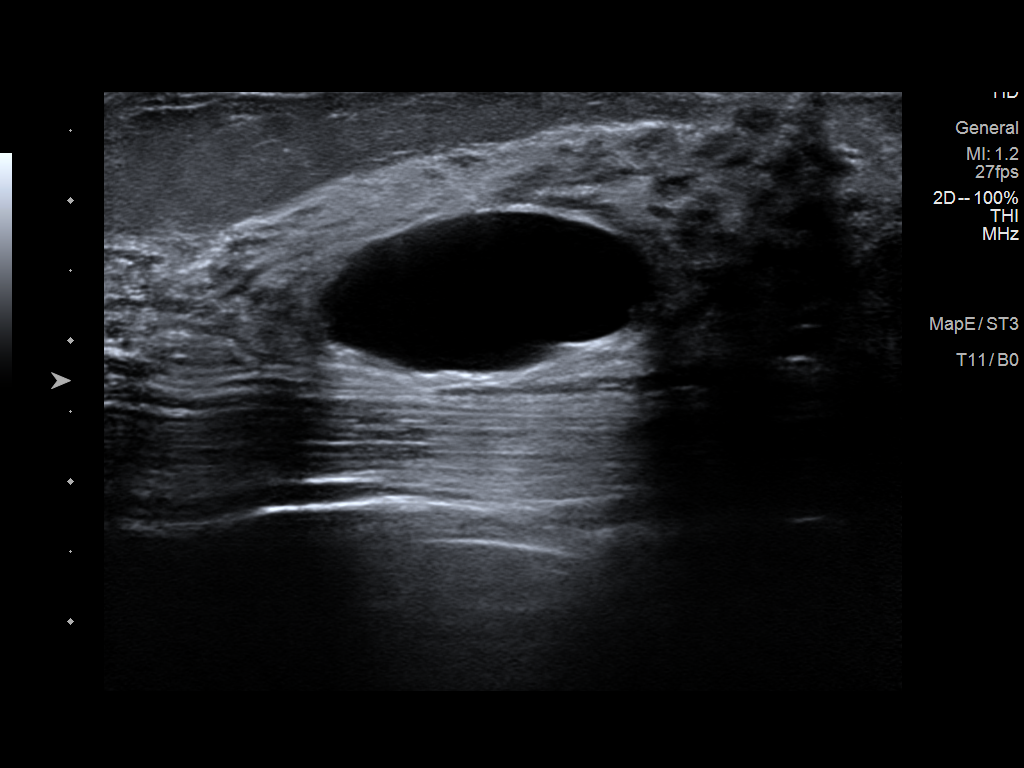
[im 5/5]
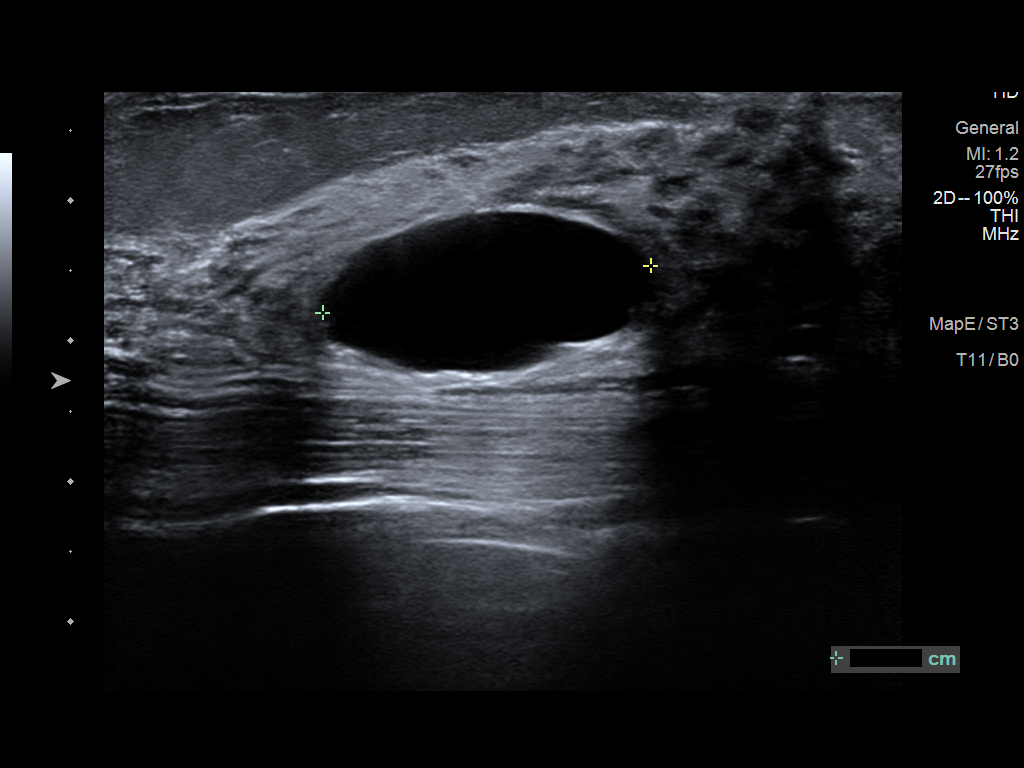

[5 of 5 positions shown; findings below may reference images not displayed]

FINDINGS: Targeted ultrasound is performed, showing a well-circumscribed
anechoic cyst with increased through transmission in the right
breast 11 o'clock 1 cm from the nipple measuring 2.5 x 1.1 x 2.4 cm.
IMPRESSION: Right breast cyst.

RECOMMENDATION:
Bilateral screening mammogram in 1 year is recommended.

I have discussed the findings and recommendations with the patient.
If applicable, a reminder letter will be sent to the patient
regarding the next appointment.

BI-RADS CATEGORY  2: Benign.

## 2020-04-11 ENCOUNTER — Ambulatory Visit: Payer: 59 | Admitting: Physician Assistant

## 2020-04-16 ENCOUNTER — Ambulatory Visit: Payer: 59 | Admitting: Physician Assistant

## 2021-11-28 ENCOUNTER — Encounter: Payer: Self-pay | Admitting: Gastroenterology

## 2022-01-01 ENCOUNTER — Ambulatory Visit (AMBULATORY_SURGERY_CENTER): Payer: Self-pay | Admitting: *Deleted

## 2022-01-01 VITALS — Ht 67.5 in | Wt 177.2 lb

## 2022-01-01 DIAGNOSIS — Z1211 Encounter for screening for malignant neoplasm of colon: Secondary | ICD-10-CM

## 2022-01-01 MED ORDER — NA SULFATE-K SULFATE-MG SULF 17.5-3.13-1.6 GM/177ML PO SOLN: 1.0000 | Freq: Once | ORAL | 0 refills | Status: AC

## 2022-01-01 NOTE — Progress Notes (Signed)

## 2022-01-17 ENCOUNTER — Encounter: Payer: Self-pay | Admitting: Gastroenterology

## 2022-01-22 ENCOUNTER — Encounter: Payer: Self-pay | Admitting: Gastroenterology

## 2022-01-22 ENCOUNTER — Ambulatory Visit (AMBULATORY_SURGERY_CENTER): Payer: 59 | Admitting: Gastroenterology

## 2022-01-22 VITALS — BP 128/82 | HR 81 | Temp 98.4°F | Resp 16 | Ht 67.0 in | Wt 177.0 lb

## 2022-01-22 DIAGNOSIS — K635 Polyp of colon: Secondary | ICD-10-CM

## 2022-01-22 DIAGNOSIS — Z800 Family history of malignant neoplasm of digestive organs: Secondary | ICD-10-CM

## 2022-01-22 DIAGNOSIS — D122 Benign neoplasm of ascending colon: Secondary | ICD-10-CM

## 2022-01-22 DIAGNOSIS — D120 Benign neoplasm of cecum: Secondary | ICD-10-CM

## 2022-01-22 DIAGNOSIS — Z1211 Encounter for screening for malignant neoplasm of colon: Secondary | ICD-10-CM | POA: Diagnosis not present

## 2022-01-22 DIAGNOSIS — Z8371 Family history of colonic polyps: Secondary | ICD-10-CM

## 2022-01-22 DIAGNOSIS — D124 Benign neoplasm of descending colon: Secondary | ICD-10-CM

## 2022-01-22 MED ORDER — SODIUM CHLORIDE 0.9 % IV SOLN: 500.0000 mL | Freq: Once | INTRAVENOUS | Status: DC

## 2022-01-22 NOTE — Progress Notes (Signed)
Pt's states no medical or surgical changes since previsit or office visit. 

## 2022-01-22 NOTE — Op Note (Signed)
[facility] Patient Name: Jennifer Schneider Procedure Date: 01/22/2022 9:38 AM MRN: [id number] Endoscopist: Thornton Park MD, MD Age: 51 Referring MD:  Date of Birth: September 13, 1970 Gender: Female Account #: [id number] Procedure:                Colonoscopy Indications:              Screening for colorectal malignant neoplasm, This                            is the patient's first colonoscopy                           Mother and father with colon polyps                           Maternal grandmother with colon cancer Medicines:                Monitored Anesthesia Care Procedure:                Pre-Anesthesia Assessment:                           - Prior to the procedure, a History and Physical                            was performed, and patient medications and                            allergies were reviewed. The patient's tolerance of                            previous anesthesia was also reviewed. The risks                            and benefits of the procedure and the sedation                            options and risks were discussed with the patient.                            All questions were answered, and informed consent                            was obtained. Prior Anticoagulants: The patient has                            taken no previous anticoagulant or antiplatelet                            agents. ASA Grade Assessment: I - A normal, healthy                            patient. After reviewing the risks and benefits,  the patient was deemed in satisfactory condition to                            undergo the procedure.                           After obtaining informed consent, the colonoscope                            was passed under direct vision. Throughout the                            procedure, the patient's blood pressure, pulse, and                            oxygen saturations were monitored continuously. The                             Olympus PCF-H190DL ([id number]) Colonoscope was                            introduced through the anus and advanced to the the                            cecum, identified by appendiceal orifice and                            ileocecal valve. A second forward view of the right                            colon was performed. The colonoscopy was performed                            with moderate difficulty due to a redundant colon.                            The patient tolerated the procedure well. The                            quality of the bowel preparation was good. The                            ileocecal valve, appendiceal orifice, and rectum                            were photographed. Scope In: 10:00:23 AM Scope Out: 10:21:08 AM Scope Withdrawal Time: 0 hours 15 minutes 20 seconds  Total Procedure Duration: 0 hours 20 minutes 45 seconds  Findings:                 The perianal and digital rectal examinations were                            normal.  Multiple small and large-mouthed diverticula were                            found in the sigmoid colon.                           A 10 mm polyp was found in the descending colon.                            The polyp was flat. The polyp was removed with a                            cold snare. Resection and retrieval were complete.                            Estimated blood loss was minimal.                           A 10 mm polyp was found in the ascending colon. The                            polyp was flat. The polyp was removed with a cold                            snare. Resection and retrieval were complete.                            Estimated blood loss was minimal.                           A 10 mm polyp was found in the cecum. The polyp was                            flat. The polyp was removed with a piecemeal                            technique using a cold snare. Resection and                             retrieval were complete. Estimated blood loss was                            minimal.                           The exam was otherwise without abnormality on                            direct and retroflexion views. Complications:            No immediate complications. Estimated Blood Loss:     Estimated blood loss was minimal. Impression:               - Diverticulosis in the sigmoid colon.                           -  One 10 mm polyp in the descending colon, removed                            with a cold snare. Resected and retrieved.                           - One 10 mm polyp in the ascending colon, removed                            with a cold snare. Resected and retrieved.                           - One 10 mm polyp in the cecum, removed piecemeal                            using a cold snare. Resected and retrieved.                           - The examination was otherwise normal on direct                            and retroflexion views. Recommendation:           - Patient has a contact number available for                            emergencies. The signs and symptoms of potential                            delayed complications were discussed with the                            patient. Return to normal activities tomorrow.                            Written discharge instructions were provided to the                            patient.                           - High fiber diet.                           - Continue present medications.                           - Await pathology results.                           - Repeat colonoscopy date to be determined after                            pending pathology results are reviewed for  surveillance.                           - Emerging evidence supports eating a diet of                            fruits, vegetables, grains, calcium, and yogurt                            while  reducing red meat and alcohol may reduce the                            risk of colon cancer.                           - Thank you for allowing me to be involved in your                            colon cancer prevention. Thornton Park MD, MD 01/22/2022 10:27:33 AM This report has been signed electronically.

## 2022-01-22 NOTE — Patient Instructions (Addendum)
Handouts were given to your care partner on polyps, diverticulosis, and a high fiber diet with liberal fluid intake. You may resume your current medications today. Await biopsy results.  May take 1-3 weeks to receive pathology results. Please call if any questions or concerns.       YOU HAD AN ENDOSCOPIC PROCEDURE TODAY AT [facility]:   Refer to the procedure report that was given to you for any specific questions about what was found during the examination.  If the procedure report does not answer your questions, please call your gastroenterologist to clarify.  If you requested that your care partner not be given the details of your procedure findings, then the procedure report has been included in a sealed envelope for you to review at your convenience later.  YOU SHOULD EXPECT: Some feelings of bloating in the abdomen. Passage of more gas than usual.  Walking can help get rid of the air that was put into your GI tract during the procedure and reduce the bloating. If you had a lower endoscopy (such as a colonoscopy or flexible sigmoidoscopy) you may notice spotting of blood in your stool or on the toilet paper. If you underwent a bowel prep for your procedure, you may not have a normal bowel movement for a few days.  Please Note:  You might notice some irritation and congestion in your nose or some drainage.  This is from the oxygen used during your procedure.  There is no need for concern and it should clear up in a day or so.  SYMPTOMS TO REPORT IMMEDIATELY:  Following lower endoscopy (colonoscopy or flexible sigmoidoscopy):  Excessive amounts of blood in the stool  Significant tenderness or worsening of abdominal pains  Swelling of the abdomen that is new, acute  Fever of 100F or higher   For urgent or emergent issues, a gastroenterologist can be reached at any hour by calling [phone]. Do not use MyChart messaging for urgent concerns.    DIET:  We do  recommend a small meal at first, but then you may proceed to your regular diet.  Drink plenty of fluids but you should avoid alcoholic beverages for 24 hours.  ACTIVITY:  You should plan to take it easy for the rest of today and you should NOT DRIVE or use heavy machinery until tomorrow (because of the sedation medicines used during the test).    FOLLOW UP: Our staff will call the number listed on your records the next business day following your procedure.  We will call around 7:15- 8:00 am to check on you and address any questions or concerns that you may have regarding the information given to you following your procedure. If we do not reach you, we will leave a message.  If you develop any symptoms (ie: fever, flu-like symptoms, shortness of breath, cough etc.) before then, please call [phone].  If you test positive for Covid 19 in the 2 weeks post procedure, please call and report this information to [place].    If any biopsies were taken you will be contacted by phone or by letter within the next 1-3 weeks.  Please call us at [phone] if you have not heard about the biopsies in 3 weeks.    SIGNATURES/CONFIDENTIALITY: You and/or your care partner have signed paperwork which will be entered into your electronic medical record.  These signatures attest to the fact that that the information above on your After Visit Summary has been reviewed and  is understood.  Full responsibility of the confidentiality of this discharge information lies with you and/or your care-partner.

## 2022-01-22 NOTE — Progress Notes (Signed)
Called to room to assist during endoscopic procedure.  Patient ID and intended procedure confirmed with present staff. Received instructions for my participation in the procedure from the performing physician.  

## 2022-01-22 NOTE — Progress Notes (Signed)
Sedate, gd SR, tolerated procedure well, VSS, report to RN 

## 2022-01-22 NOTE — Progress Notes (Signed)
No problems noted in the recovery room. maw 

## 2022-01-22 NOTE — Progress Notes (Signed)
Referring Provider: Everlene Farrier, MD Primary Care Physician:  Patient, No Pcp Per  Indication for Procedure:  Colon cancer screening   IMPRESSION:  Need for colon cancer screening Appropriate candidate for monitored anesthesia care  PLAN: Colonoscopy in the [place] today   HPI: ANIS CINELLI is a 51 y.o. female presents for screening colonoscopy.  No prior colonoscopy or colon cancer screening.  No baseline GI symptoms.   Father and mother with colon polyps. Maternal grandmother with colon cancer. No other known family history of colon cancer or polyps. No family history of uterine/endometrial cancer, pancreatic cancer or gastric/stomach cancer.   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 (refer to [facility])   Atypical mole 12/17/2004   left inferior breast - slight to moderate atypia   Atypical mole 12/17/2004   left sternum - slight to moderate atypia   Atypical mole 05/04/2005   left med sternal notch - slight to moderate atypia   Atypical mole 05/04/2005   right med lower leg - moderate atypia (widershave)   Atypical mole 05/04/2005   right paraspinal lower back - moderate atypia   Atypical mole 12/11/2011   left lat scapula - mild atypia   Atypical mole 12/11/2011   spinal upper back - moderate atypia   Atypical mole 12/11/2011   left paraspinal mid to lower back - mild atypia    Past Surgical History:  Procedure Laterality Date   APPENDECTOMY     age 59   CESAREAN SECTION     2010   follapian tube     one tied,2008    Current Outpatient Medications  Medication Sig Dispense Refill   Multiple Vitamins-Minerals (MULTIVITAMIN WOMEN PO) daily.     levonorgestrel (MIRENA, 52 MG,) 20 MCG/DAY IUD Mirena 21 mcg/24 hours (8 yrs) 52 mg intrauterine  device  provided by care center     Current Facility-Administered Medications  Medication Dose Route Frequency Provider Last Rate Last Admin   0.9 %  sodium chloride infusion  500 mL Intravenous Once Thornton Park, MD        Allergies as of 01/22/2022   (No Known Allergies)    Family History  Problem Relation Age of Onset   Colon polyps Mother    Melanoma Mother    Colon polyps Father    Eczema Father    Colon cancer Maternal Grandmother    Crohn's disease Neg Hx    Esophageal cancer Neg Hx    Rectal cancer Neg Hx    Stomach cancer Neg Hx      Physical Exam: General:   Alert,  well-nourished, pleasant and cooperative in NAD Head:  Normocephalic and atraumatic. Eyes:  Sclera clear, no icterus.   Conjunctiva pink. Mouth:  No deformity or lesions.   Neck:  Supple; no masses or thyromegaly. Lungs:  Clear throughout to auscultation.   No wheezes. Heart:  Regular rate and rhythm; no murmurs. Abdomen:  Soft, non-tender, nondistended, normal bowel sounds, no rebound or guarding.  Msk:  Symmetrical. No boney deformities LAD: No inguinal or umbilical LAD Extremities:  No clubbing or edema. Neurologic:  Alert and  oriented x4;  grossly nonfocal Skin:  No obvious rash or bruise. Psych:  Alert and cooperative. Normal mood and affect.  Studies/Results: No results found.    Demitrius Crass L. Tarri Glenn, MD, MPH 01/22/2022, 9:47 AM

## 2022-01-23 ENCOUNTER — Telehealth: Payer: Self-pay

## 2022-01-23 NOTE — Telephone Encounter (Signed)
  Follow up Call-     01/22/2022    9:23 AM  Call back number  Post procedure Call Back phone  # [phone]  Permission to leave phone message Yes     Patient questions:  Do you have a fever, pain , or abdominal swelling? No. Pain Score  0 *  Have you tolerated food without any problems? Yes.    Have you been able to return to your normal activities? Yes.    Do you have any questions about your discharge instructions: Diet   No. Medications  No. Follow up visit  No.  Do you have questions or concerns about your Care? No.  Actions: * If pain score is 4 or above: No action needed, pain <4.

## 2022-01-26 ENCOUNTER — Encounter: Payer: Self-pay | Admitting: Gastroenterology
